# Patient Record
Sex: Female | Born: 1990 | Race: Black or African American | Hispanic: No | Marital: Single | Smoking: Current every day smoker
Health system: Southern US, Community
[De-identification: ages and names within clinical notes are randomized; demographics above are authoritative.]

## PROBLEM LIST (undated history)

## (undated) DIAGNOSIS — K219 Gastro-esophageal reflux disease without esophagitis: Secondary | ICD-10-CM

## (undated) HISTORY — PX: TONSILLECTOMY: SUR1361

## (undated) HISTORY — DX: Gastro-esophageal reflux disease without esophagitis: K21.9

---

## 2012-10-14 ENCOUNTER — Emergency Department (HOSPITAL_COMMUNITY)
Admission: EM | Admit: 2012-10-14 | Discharge: 2012-10-14 | Disposition: A | Discharge status: Home / Self Care | Payer: No Typology Code available for payment source | Attending: Emergency Medicine | Admitting: Emergency Medicine

## 2012-10-14 ENCOUNTER — Emergency Department (HOSPITAL_COMMUNITY): Payer: No Typology Code available for payment source

## 2012-10-14 ENCOUNTER — Encounter (HOSPITAL_COMMUNITY): Payer: Self-pay | Admitting: Unknown Physician Specialty

## 2012-10-14 DIAGNOSIS — Y939 Activity, unspecified: Secondary | ICD-10-CM | POA: Insufficient documentation

## 2012-10-14 DIAGNOSIS — Y929 Unspecified place or not applicable: Secondary | ICD-10-CM | POA: Insufficient documentation

## 2012-10-14 DIAGNOSIS — R42 Dizziness and giddiness: Secondary | ICD-10-CM | POA: Insufficient documentation

## 2012-10-14 DIAGNOSIS — F17200 Nicotine dependence, unspecified, uncomplicated: Secondary | ICD-10-CM | POA: Insufficient documentation

## 2012-10-14 DIAGNOSIS — S134XXA Sprain of ligaments of cervical spine, initial encounter: Secondary | ICD-10-CM

## 2012-10-14 DIAGNOSIS — M549 Dorsalgia, unspecified: Secondary | ICD-10-CM | POA: Insufficient documentation

## 2012-10-14 DIAGNOSIS — IMO0001 Reserved for inherently not codable concepts without codable children: Secondary | ICD-10-CM | POA: Insufficient documentation

## 2012-10-14 DIAGNOSIS — S139XXA Sprain of joints and ligaments of unspecified parts of neck, initial encounter: Secondary | ICD-10-CM | POA: Insufficient documentation

## 2012-10-14 DIAGNOSIS — R51 Headache: Secondary | ICD-10-CM | POA: Insufficient documentation

## 2012-10-14 LAB — PREGNANCY, URINE: Preg Test, Ur: NEGATIVE

## 2012-10-14 MED ORDER — KETOROLAC TROMETHAMINE 30 MG/ML IJ SOLN
30.0000 mg | Freq: Once | INTRAMUSCULAR | Status: AC
  Administered 2012-10-14: 30 mg via INTRAVENOUS
  Filled 2012-10-14: qty 1

## 2012-10-14 MED ORDER — ONDANSETRON HCL 4 MG/2ML IJ SOLN: 4.0000 mg | Freq: Once | INTRAMUSCULAR | Status: DC

## 2012-10-14 MED ORDER — IBUPROFEN 800 MG PO TABS: 800.0000 mg | ORAL_TABLET | Freq: Three times a day (TID) | ORAL | Status: AC | PRN

## 2012-10-14 MED ORDER — DIAZEPAM 5 MG PO TABS: 5.0000 mg | ORAL_TABLET | Freq: Three times a day (TID) | ORAL | Status: DC | PRN

## 2012-10-14 MED ORDER — HYDROMORPHONE HCL PF 1 MG/ML IJ SOLN
1.0000 mg | Freq: Once | INTRAMUSCULAR | Status: AC
  Administered 2012-10-14: 1 mg via INTRAVENOUS
  Filled 2012-10-14: qty 1

## 2012-10-14 MED ORDER — HYDROCODONE-ACETAMINOPHEN 5-325 MG PO TABS: 1.0000 | ORAL_TABLET | ORAL | Status: DC | PRN

## 2012-10-14 MED ORDER — METHOCARBAMOL 100 MG/ML IJ SOLN
1000.0000 mg | Freq: Once | INTRAMUSCULAR | Status: DC
  Filled 2012-10-14: qty 10

## 2012-10-14 MED ORDER — LORAZEPAM 2 MG/ML IJ SOLN
0.5000 mg | Freq: Once | INTRAMUSCULAR | Status: AC
  Administered 2012-10-14: 0.5 mg via INTRAVENOUS
  Filled 2012-10-14: qty 1

## 2012-10-14 MED ORDER — ONDANSETRON HCL 4 MG/2ML IJ SOLN
4.0000 mg | Freq: Once | INTRAMUSCULAR | Status: AC
  Administered 2012-10-14: 4 mg via INTRAVENOUS
  Filled 2012-10-14: qty 2
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
## 2012-10-14 NOTE — ED Notes (Signed)
Called Radiology to pick patient up post pregnancy test.
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
## 2012-10-14 NOTE — ED Provider Notes (Signed)
Medical screening examination/treatment/procedure(s) were performed by non-physician practitioner and as supervising physician I was immediately available for consultation/collaboration.   Carleene Cooper III, MD 10/14/12 [phone]

## 2012-10-15 NOTE — ED Provider Notes (Signed)
Medical screening examination/treatment/procedure(s) were performed by non-physician practitioner and as supervising physician I was immediately available for consultation/collaboration.   Carleene Cooper III, MD 10/15/12 [phone]

## 2012-12-12 ENCOUNTER — Emergency Department (HOSPITAL_BASED_OUTPATIENT_CLINIC_OR_DEPARTMENT_OTHER)
Admission: EM | Admit: 2012-12-12 | Discharge: 2012-12-12 | Disposition: A | Discharge status: Home / Self Care | Payer: Self-pay | Attending: Emergency Medicine | Admitting: Emergency Medicine

## 2012-12-12 ENCOUNTER — Encounter (HOSPITAL_BASED_OUTPATIENT_CLINIC_OR_DEPARTMENT_OTHER): Payer: Self-pay | Admitting: *Deleted

## 2012-12-12 DIAGNOSIS — Y929 Unspecified place or not applicable: Secondary | ICD-10-CM | POA: Insufficient documentation

## 2012-12-12 DIAGNOSIS — Z87828 Personal history of other (healed) physical injury and trauma: Secondary | ICD-10-CM | POA: Insufficient documentation

## 2012-12-12 DIAGNOSIS — Y939 Activity, unspecified: Secondary | ICD-10-CM | POA: Insufficient documentation

## 2012-12-12 DIAGNOSIS — S139XXA Sprain of joints and ligaments of unspecified parts of neck, initial encounter: Secondary | ICD-10-CM | POA: Insufficient documentation

## 2012-12-12 DIAGNOSIS — F17200 Nicotine dependence, unspecified, uncomplicated: Secondary | ICD-10-CM | POA: Insufficient documentation

## 2012-12-12 DIAGNOSIS — S161XXA Strain of muscle, fascia and tendon at neck level, initial encounter: Secondary | ICD-10-CM

## 2012-12-12 DIAGNOSIS — X500XXA Overexertion from strenuous movement or load, initial encounter: Secondary | ICD-10-CM | POA: Insufficient documentation

## 2012-12-12 MED ORDER — DEXAMETHASONE SODIUM PHOSPHATE 10 MG/ML IJ SOLN
10.0000 mg | Freq: Once | INTRAMUSCULAR | Status: AC
  Administered 2012-12-12: 10 mg via INTRAMUSCULAR
  Filled 2012-12-12: qty 1

## 2012-12-12 MED ORDER — PREDNISONE 10 MG PO TABS: 20.0000 mg | ORAL_TABLET | Freq: Every day | ORAL | Status: DC

## 2012-12-12 MED ORDER — OXYCODONE-ACETAMINOPHEN 5-325 MG PO TABS: 2.0000 | ORAL_TABLET | ORAL | Status: DC | PRN

## 2012-12-12 NOTE — ED Notes (Signed)
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

## 2012-12-12 NOTE — ED Notes (Signed)
Pt on phone with mother-mother concerned pt will need work note-advised her that I would speak with EDP r/t  RTW note
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
## 2012-12-12 NOTE — ED Notes (Signed)
Pt c/o left side neck pain x 3 days w/o injury

## 2013-01-08 NOTE — ED Notes (Signed)
I received a phone call from a pharmacist at [facility] in [place] inquiring about the prescriptions that this patient was prescribed during this visit.  Pt stated to pharmacist that she was given a prescription for antibiotics which she had filled at [facility] OP pharmacy and never filled the RX for percocet.  Pt is now presenting RX for percocet at [facility] asking for it to be filled there.  Pharmacist is asking for verification that there was one rx filled at our OP pharmacy, however I am unable to verify this due to the pharmacy being closed at the present time.  Pharmacist was informed that the patient was prescribed two medications, Percocet and prednisone and their dosages, quantities prescribed, and both with no refills.
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
## 2013-06-22 ENCOUNTER — Encounter (HOSPITAL_BASED_OUTPATIENT_CLINIC_OR_DEPARTMENT_OTHER): Payer: Self-pay | Admitting: *Deleted

## 2013-06-22 ENCOUNTER — Emergency Department (HOSPITAL_BASED_OUTPATIENT_CLINIC_OR_DEPARTMENT_OTHER)
Admission: EM | Admit: 2013-06-22 | Discharge: 2013-06-22 | Disposition: A | Discharge status: Home / Self Care | Payer: Self-pay | Attending: Emergency Medicine | Admitting: Emergency Medicine

## 2013-06-22 DIAGNOSIS — IMO0002 Reserved for concepts with insufficient information to code with codable children: Secondary | ICD-10-CM | POA: Insufficient documentation

## 2013-06-22 DIAGNOSIS — Z79899 Other long term (current) drug therapy: Secondary | ICD-10-CM | POA: Insufficient documentation

## 2013-06-22 DIAGNOSIS — Z3202 Encounter for pregnancy test, result negative: Secondary | ICD-10-CM | POA: Insufficient documentation

## 2013-06-22 DIAGNOSIS — F17200 Nicotine dependence, unspecified, uncomplicated: Secondary | ICD-10-CM | POA: Insufficient documentation

## 2013-06-22 DIAGNOSIS — R112 Nausea with vomiting, unspecified: Secondary | ICD-10-CM | POA: Insufficient documentation

## 2013-06-22 DIAGNOSIS — R1013 Epigastric pain: Secondary | ICD-10-CM | POA: Insufficient documentation

## 2013-06-22 LAB — URINALYSIS, ROUTINE W REFLEX MICROSCOPIC
Glucose, UA: NEGATIVE mg/dL
Ketones, ur: NEGATIVE mg/dL
Leukocytes, UA: NEGATIVE
Nitrite: NEGATIVE
Specific Gravity, Urine: 1.018 (ref 1.005–1.030)
pH: 7 (ref 5.0–8.0)

## 2013-06-22 LAB — COMPREHENSIVE METABOLIC PANEL
ALT: 23 U/L (ref 0–35)
Albumin: 3.9 g/dL (ref 3.5–5.2)
Alkaline Phosphatase: 84 U/L (ref 39–117)
BUN: 13 mg/dL (ref 6–23)
Chloride: 101 mEq/L (ref 96–112)
GFR calc Af Amer: >90 mL/min (ref >90)
Glucose, Bld: 124 mg/dL — ABNORMAL HIGH (ref 70–99)
Potassium: 4 mEq/L (ref 3.5–5.1)
Sodium: 138 mEq/L (ref 135–145)
Total Bilirubin: 0.2 mg/dL — ABNORMAL LOW (ref 0.3–1.2)
Total Protein: 7.6 g/dL (ref 6.0–8.3)

## 2013-06-22 LAB — CBC WITH DIFFERENTIAL/PLATELET
Hemoglobin: 12.7 g/dL (ref 12.0–15.0)
Lymphocytes Relative: 29 % (ref 12–46)
Lymphs Abs: 3.7 10*3/uL (ref 0.7–4.0)
Monocytes Relative: 6 % (ref 3–12)
Neutro Abs: 7.9 10*3/uL — ABNORMAL HIGH (ref 1.7–7.7)
Neutrophils Relative %: 63 % (ref 43–77)
Platelets: 394 10*3/uL (ref 150–400)
RBC: 4.93 MIL/uL (ref 3.87–5.11)
WBC: 12.5 10*3/uL — ABNORMAL HIGH (ref 4.0–10.5)

## 2013-06-22 LAB — LIPASE, BLOOD: Lipase: 31 U/L (ref 11–59)

## 2013-06-22 LAB — PREGNANCY, URINE: Preg Test, Ur: NEGATIVE

## 2013-06-22 MED ORDER — SUCRALFATE 1 G PO TABS: 1.0000 g | ORAL_TABLET | Freq: Four times a day (QID) | ORAL | Status: DC

## 2013-06-22 MED ORDER — OMEPRAZOLE 20 MG PO CPDR: DELAYED_RELEASE_CAPSULE | ORAL | Status: DC

## 2013-06-22 NOTE — ED Notes (Signed)
Pt reports increase in heartburn and (L) abdominal pain that is sharp intermittent pain.  Denies urinary symptoms.  LBM: 2 days ago.  Denies constipation. Reports pain improves after eating.  nontender on palpation

## 2013-06-22 NOTE — ED Provider Notes (Signed)
CSN: [id number]     Arrival date & time 06/22/13  1717 History     First MD Initiated Contact with Patient 06/22/13 1719     Chief Complaint  Patient presents with  . Abdominal Pain   (Consider location/radiation/quality/duration/timing/severity/associated sxs/prior Treatment) HPI Comments: Patient presents with complaint of upper abdominal pain for the past several weeks. Patient describes pain as sharp and throbbing with occasional radiation to the back. It is associated with nausea and occasional vomiting. It is made better with food. Patient denies fevers, chest pain, cough, diarrhea, blood in her stools, melena, urinary symptoms including dysuria and hematuria. Patient admits to drinking alcohol every day. She takes 2-3 packets of Goody powder every night before bed to help her sleep. She is also a smoker. No history of stomach ulcers. Onset of symptoms acute. Course is intermittent  The history is provided by the patient.    History reviewed. No pertinent past medical history. Past Surgical History  Procedure Laterality Date  . Tonsillectomy     History reviewed. No pertinent family history. History  Substance Use Topics  . Smoking status: Current Every Day Smoker -- 0.50 packs/day    Types: Cigarettes  . Smokeless tobacco: Not on file  . Alcohol Use: 3.0 oz/week    5 Shots of liquor per week     Comment: 2-3 times weekly    OB History   Grav Para Term Preterm Abortions TAB SAB Ect Mult Living                 Review of Systems  Constitutional: Negative for fever.  HENT: Negative for sore throat and rhinorrhea.   Eyes: Negative for redness.  Respiratory: Negative for cough.   Cardiovascular: Negative for chest pain.  Gastrointestinal: Positive for nausea, vomiting and abdominal pain. Negative for diarrhea and blood in stool.  Genitourinary: Negative for dysuria.  Musculoskeletal: Negative for myalgias.  Skin: Negative for rash.  Neurological: Negative for headaches.     Allergies  Peroxide  Home Medications   Current Outpatient Rx  Name  Route  Sig  Dispense  Refill  . diazepam (VALIUM) 5 MG tablet   Oral   Take 1 tablet (5 mg total) by mouth every 8 (eight) hours as needed (muscle spasm or moderate pain  ).   10 tablet   0   . HYDROcodone-acetaminophen (NORCO/VICODIN) 5-325 MG per tablet   Oral   Take 1 tablet by mouth every 4 (four) hours as needed (severe pain).   10 tablet   0   . ibuprofen (ADVIL,MOTRIN) 800 MG tablet   Oral   Take 1 tablet (800 mg total) by mouth every 8 (eight) hours as needed for pain.   21 tablet   0   . oxyCODONE-acetaminophen (PERCOCET/ROXICET) 5-325 MG per tablet   Oral   Take 2 tablets by mouth every 4 (four) hours as needed for pain.   15 tablet   0   . predniSONE (DELTASONE) 10 MG tablet   Oral   Take 2 tablets (20 mg total) by mouth daily.   10 tablet   0    BP 136/90  Pulse 95  Temp(Src) 98.1 F (36.7 C) (Oral)  Resp 18  Ht 5\' 5"  (1.651 m)  Wt 180 lb (81.647 kg)  BMI 29.95 kg/m2  SpO2 100% Physical Exam  Nursing note and vitals reviewed. Constitutional: She appears well-developed and well-nourished.  HENT:  Head: Normocephalic and atraumatic.  Eyes: Conjunctivae are normal. Right eye  exhibits no discharge. Left eye exhibits no discharge.  Neck: Normal range of motion. Neck supple.  Cardiovascular: Normal rate, regular rhythm and normal heart sounds.   Pulmonary/Chest: Effort normal and breath sounds normal.  Abdominal: Soft. There is no tenderness.  Neurological: She is alert.  Skin: Skin is warm and dry.  Psychiatric: She has a normal mood and affect.    ED Course   Procedures (including critical care time)  Labs Reviewed  URINALYSIS, ROUTINE W REFLEX MICROSCOPIC - Abnormal; Notable for the following:    APPearance CLOUDY (*)    All other components within normal limits  CBC WITH DIFFERENTIAL - Abnormal; Notable for the following:    WBC 12.5 (*)    MCV 76.3 (*)    MCH  25.8 (*)    Neutro Abs 7.9 (*)    All other components within normal limits  COMPREHENSIVE METABOLIC PANEL - Abnormal; Notable for the following:    Glucose, Bld 124 (*)    Total Bilirubin 0.2 (*)    GFR calc non Af Amer 79 (*)    All other components within normal limits  PREGNANCY, URINE  LIPASE, BLOOD   No results found. 1. Epigastric pain     6:14 PM Patient seen and examined. Work-up initiated.    Vital signs reviewed and are as follows: Filed Vitals:   06/22/13 1725  BP: 136/90  Pulse: 95  Temp: 98.1 F (36.7 C)  Resp: 18   8:37 PM patient informed of results. Her exam is stable in emergency department. She is not in any significant pain.  Patient discharged home on Prilosec and Carafate.  Patient counseled on avoidance of alcohol, Goody powder, smoking which can potentially exacerbate her symptoms.  Patient counseled on use of narcotic pain medications. Counseled not to combine these medications with others containing tylenol. Urged not to drink alcohol, drive, or perform any other activities that requires focus while taking these medications. The patient verbalizes understanding and agrees with the plan.   MDM  Patient with history and exam most consistent with peptic ulcer disease and GERD. Less likely gallbladder etiology. Labs are reassuring. Abdomen is nontender, soft on exam. Will treat conservatively with avoidance of smoking, Goody powder, alcohol. GI f/u given if not improving.   Renne Crigler, PA-C 06/22/13 2136

## 2013-06-26 NOTE — ED Provider Notes (Signed)
Medical screening examination/treatment/procedure(s) were performed by non-physician practitioner and as supervising physician I was immediately available for consultation/collaboration.    Christopher J. Pollina, MD 06/26/13 1503 

## 2013-08-04 ENCOUNTER — Encounter: Payer: Self-pay | Admitting: Internal Medicine

## 2013-08-04 ENCOUNTER — Ambulatory Visit: Payer: Self-pay | Attending: Internal Medicine | Admitting: Internal Medicine

## 2013-08-04 VITALS — BP 115/71 | HR 81 | Temp 98.6°F | Resp 16 | Ht 65.35 in | Wt 178.0 lb

## 2013-08-04 DIAGNOSIS — R0600 Dyspnea, unspecified: Secondary | ICD-10-CM

## 2013-08-04 DIAGNOSIS — R0609 Other forms of dyspnea: Secondary | ICD-10-CM

## 2013-08-04 DIAGNOSIS — K219 Gastro-esophageal reflux disease without esophagitis: Secondary | ICD-10-CM | POA: Insufficient documentation

## 2013-08-04 MED ORDER — OMEPRAZOLE 40 MG PO CPDR: 40.0000 mg | DELAYED_RELEASE_CAPSULE | Freq: Every day | ORAL | Status: AC

## 2013-08-04 MED ORDER — ALBUTEROL SULFATE HFA 108 (90 BASE) MCG/ACT IN AERS: 2.0000 | INHALATION_SPRAY | Freq: Four times a day (QID) | RESPIRATORY_TRACT | Status: AC | PRN

## 2013-08-04 NOTE — Progress Notes (Signed)
Pt is here to est care  Seen at [facility] ER in July for Acid reflux... Given Omeprazole States she has intermittent epigastric pain w/dyspnea Was referred to see a GI specialist but due to lack of insurance, has not been able to see one She is alert w/no signs of acute distress.

## 2013-08-04 NOTE — Progress Notes (Signed)
Patient ID: Renee Bernard, female   DOB: 28-Oct-1991, 22 y.o.   MRN: [id number] Patient Demographics  Renee Bernard, is a 22 y.o. female  [id number]  [id number]  DOB - Sep 18, 1991  Chief Complaint  Patient presents with  . Establish Care        Subjective:   Renee Bernard today is here to establish primary care.  The patient is a 22 year old female with no significant medical history except GERD presented to establish care. Patient reports that she's been having intermittent epigastric pain with acid reflux. Sometimes she has noticed that she also has dyspnea and she is unable to breathe. She felt that there was only one time when she had wheezing. Her school had just started otherwise she denies any acute panic attacks or significant stress or depression.  Patient has No headache, No chest pain,  No Nausea, No new weakness tingling or numbness, No Cough  Objective:    Filed Vitals:   08/04/13 1045  BP: 115/71  Pulse: 81  Temp: 98.6 F (37 C)  TempSrc: Oral  Resp: 16  Height: 5' 5.35" (1.66 m)  Weight: 178 lb (80.74 kg)  SpO2: 100%     ALLERGIES:   Allergies  Allergen Reactions  . Peroxide [Hydrogen Peroxide] Swelling    PAST MEDICAL HISTORY: Past Medical History  Diagnosis Date  . Acid reflux     PAST SURGICAL HISTORY: Past Surgical History  Procedure Laterality Date  . Tonsillectomy      FAMILY HISTORY: Family History  Problem Relation Age of Onset  . Hypertension Mother   . Hypertension Brother   . Cancer Maternal Grandmother     MEDICATIONS AT HOME: Prior to Admission medications   Medication Sig Start Date End Date Taking? Authorizing Provider  diazepam (VALIUM) 5 MG tablet Take 1 tablet (5 mg total) by mouth every 8 (eight) hours as needed (muscle spasm or moderate pain  ). 10/14/12   Trixie Dredge, PA-C  HYDROcodone-acetaminophen (NORCO/VICODIN) 5-325 MG per tablet Take 1 tablet by mouth every 4 (four) hours as needed (severe pain).  10/14/12   Trixie Dredge, PA-C  ibuprofen (ADVIL,MOTRIN) 800 MG tablet Take 1 tablet (800 mg total) by mouth every 8 (eight) hours as needed for pain. 10/14/12   Trixie Dredge, PA-C  omeprazole (PRILOSEC) 20 MG capsule Take one capsule PO twice a day for 3 days, then one capsule PO once a day 06/22/13   Renne Crigler, PA-C  omeprazole (PRILOSEC) 40 MG capsule Take 1 capsule (40 mg total) by mouth daily. 08/04/13   Ripudeep Jenna Luo, MD  oxyCODONE-acetaminophen (PERCOCET/ROXICET) 5-325 MG per tablet Take 2 tablets by mouth every 4 (four) hours as needed for pain. 12/12/12   Rolan Bucco, MD  predniSONE (DELTASONE) 10 MG tablet Take 2 tablets (20 mg total) by mouth daily. 12/12/12   Rolan Bucco, MD  sucralfate (CARAFATE) 1 G tablet Take 1 tablet (1 g total) by mouth 4 (four) times daily. Take at meals and before bed. 06/22/13   Renne Crigler, PA-C    REVIEW OF SYSTEMS:  Constitutional:   No   Fevers, chills, fatigue.  HEENT:    No headaches, Sore throat,   Cardio-vascular: No chest pain,  Orthopnea, swelling in lower extremities, anasarca, palpitations  GI:  Please see history of present illness  Resp:  No coughing up of blood.No cough.No wheezing. Please see history of present illness  Skin:  no rash or lesions.  GU:  no dysuria, change in color of urine, no  urgency or frequency.  No flank pain.  Musculoskeletal: No joint pain or swelling.  No decreased range of motion.  No back pain.  Psych: No change in mood or affect. No depression or anxiety.  No memory loss.   Exam  General appearance :Awake, alert, NAD, Speech Clear. HEENT: Atraumatic and Normocephalic, PERLA Neck: supple, no JVD. No cervical lymphadenopathy.  Chest: clear to auscultation bilaterally, no wheezing, rales or rhonchi CVS: S1 S2 regular, no murmurs.  Abdomen: soft, NBS, NT, ND, no gaurding, rigidity or rebound. Extremities: No cyanosis, clubbing, B/L Lower Ext shows no edema,  Neurology: Awake alert, and oriented  X 3, CN II-XII intact, Non focal Skin:No Rash or lesions Wounds: N/A    Data Review   Basic Metabolic Panel: No results found for this basename: NA, K, CL, CO2, GLUCOSE, BUN, CREATININE, CALCIUM, MG, PHOS,  in the last 168 hours Liver Function Tests: No results found for this basename: AST, ALT, ALKPHOS, BILITOT, PROT, ALBUMIN,  in the last 168 hours  CBC: No results found for this basename: WBC, NEUTROABS, HGB, HCT, MCV, PLT,  in the last 168 hours ------------------------------------------------------------------------------------------------------------------ No results found for this basename: HGBA1C,  in the last 72 hours ------------------------------------------------------------------------------------------------------------------ No results found for this basename: CHOL, HDL, LDLCALC, TRIG, CHOLHDL, LDLDIRECT,  in the last 72 hours ------------------------------------------------------------------------------------------------------------------ No results found for this basename: TSH, T4TOTAL, FREET3, T3FREE, THYROIDAB,  in the last 72 hours ------------------------------------------------------------------------------------------------------------------ No results found for this basename: VITAMINB12, FOLATE, FERRITIN, TIBC, IRON, RETICCTPCT,  in the last 72 hours  Coagulation profile  No results found for this basename: INR, PROTIME,  in the last 168 hours    Assessment & Plan   Active Problems: GERD: - Placed on omeprazole 40 mg daily, will continue with 4-6 weeks daily to assess improvement in her symptoms - Advise patient to stop taking it ibuprofen - Advise patient to stop spicy foods, fried foods, coffee  Dyspnea: Appears to be related with GERD versus acute anxiety versus intermittent asthma, patient has no wheezing at the time of physical examination. She just quit smoking and marijuana last month.  - Will try the albuterol inhaler as needed, chest x-ray - If  she still has the symptoms despite omeprazole and albuterol inhaler, check PFTs. May need a psych evaluation for acute panic attacks   Recommendations:Flu shot today Ambulatory refer to OB/GYN for Pap smear  Follow-up in 8 weeks   RAI,RIPUDEEP M.D. 08/04/2013, 11:04 AM

## 2013-08-08 ENCOUNTER — Encounter: Payer: Self-pay | Admitting: Medical

## 2013-09-29 ENCOUNTER — Encounter: Payer: Self-pay | Admitting: Medical

## 2013-10-04 ENCOUNTER — Ambulatory Visit: Payer: Self-pay

## 2014-03-13 ENCOUNTER — Encounter (HOSPITAL_BASED_OUTPATIENT_CLINIC_OR_DEPARTMENT_OTHER): Payer: Self-pay | Admitting: Emergency Medicine

## 2014-03-13 ENCOUNTER — Emergency Department (HOSPITAL_BASED_OUTPATIENT_CLINIC_OR_DEPARTMENT_OTHER)
Admission: EM | Admit: 2014-03-13 | Discharge: 2014-03-13 | Disposition: A | Discharge status: Home / Self Care | Payer: No Typology Code available for payment source | Attending: Emergency Medicine | Admitting: Emergency Medicine

## 2014-03-13 DIAGNOSIS — K219 Gastro-esophageal reflux disease without esophagitis: Secondary | ICD-10-CM | POA: Insufficient documentation

## 2014-03-13 DIAGNOSIS — H109 Unspecified conjunctivitis: Secondary | ICD-10-CM | POA: Insufficient documentation

## 2014-03-13 DIAGNOSIS — Z79899 Other long term (current) drug therapy: Secondary | ICD-10-CM | POA: Insufficient documentation

## 2014-03-13 DIAGNOSIS — F17200 Nicotine dependence, unspecified, uncomplicated: Secondary | ICD-10-CM | POA: Insufficient documentation

## 2014-03-13 MED ORDER — TOBRAMYCIN 0.3 % OP SOLN: 2.0000 [drp] | OPHTHALMIC | Status: AC

## 2014-03-13 NOTE — ED Provider Notes (Signed)
Medical screening examination/treatment/procedure(s) were performed by non-physician practitioner and as supervising physician I was immediately available for consultation/collaboration.   EKG Interpretation None        Junius ArgyleForrest S Tayona Sarnowski, MD 03/13/14 2150

## 2014-03-13 NOTE — Discharge Instructions (Signed)

## 2014-03-13 NOTE — ED Notes (Signed)
Pt. Reports she has redness and pain in the L eye.  Noted no discharge or drainage.

## 2014-03-13 NOTE — ED Provider Notes (Signed)
CSN: [id number]     Arrival date & time 03/13/14  1333 History   First MD Initiated Contact with Patient 03/13/14 1418     Chief Complaint  Patient presents with  . Eye Pain     (Consider location/radiation/quality/duration/timing/severity/associated sxs/prior Treatment) Patient is a 23 y.o. female presenting with eye pain. The history is provided by the patient. No language interpreter was used.  Eye Pain This is a new problem. The current episode started today. The problem occurs constantly. The problem has been gradually worsening. Nothing aggravates the symptoms. She has tried nothing for the symptoms. The treatment provided no relief.   Pt's employer is concerned that she has pink eye. Past Medical History  Diagnosis Date  . Acid reflux    Past Surgical History  Procedure Laterality Date  . Tonsillectomy     Family History  Problem Relation Age of Onset  . Hypertension Mother   . Hypertension Brother   . Cancer Maternal Grandmother    History  Substance Use Topics  . Smoking status: Current Every Day Smoker -- 0.50 packs/day    Types: Cigarettes  . Smokeless tobacco: Not on file  . Alcohol Use: 3.0 oz/week    5 Shots of liquor per week     Comment: 2-3 times weekly    OB History   Grav Para Term Preterm Abortions TAB SAB Ect Mult Living                 Review of Systems  Eyes: Positive for pain.  All other systems reviewed and are negative.     Allergies  Peroxide  Home Medications   Prior to Admission medications   Medication Sig Start Date End Date Taking? Authorizing Provider  albuterol (PROVENTIL HFA;VENTOLIN HFA) 108 (90 BASE) MCG/ACT inhaler Inhale 2 puffs into the lungs every 6 (six) hours as needed for wheezing. 08/04/13   Ripudeep Jenna LuoK Rai, MD  ibuprofen (ADVIL,MOTRIN) 800 MG tablet Take 1 tablet (800 mg total) by mouth every 8 (eight) hours as needed for pain. 10/14/12   Trixie DredgeEmily West, PA-C  omeprazole (PRILOSEC) 40 MG capsule Take 1 capsule (40 mg  total) by mouth daily. 08/04/13   Ripudeep Jenna LuoK Rai, MD  tobramycin (TOBREX) 0.3 % ophthalmic solution Place 2 drops into the left eye every 4 (four) hours. 03/13/14   Elson AreasLeslie K Georganna Maxson, PA-C   BP 135/78  Pulse 94  Temp(Src) 98.7 F (37.1 C) (Oral)  Resp 16  Ht 5\' 5"  (1.651 m)  Wt 185 lb (83.915 kg)  BMI 30.79 kg/m2  SpO2 100%  LMP 03/13/2014 Physical Exam  Nursing note and vitals reviewed. Constitutional: She is oriented to person, place, and time. She appears well-developed and well-nourished.  Eyes: Pupils are equal, round, and reactive to light.  Injected left eye,  No exudate  Cardiovascular: Normal rate.   Pulmonary/Chest: Effort normal.  Musculoskeletal: Normal range of motion.  Neurological: She is alert and oriented to person, place, and time. She has normal reflexes.  Skin: Skin is warm.  Psychiatric: She has a normal mood and affect.    ED Course  Procedures (including critical care time) Labs Review Labs Reviewed - No data to display  Imaging Review No results found.   EKG Interpretation None      MDM   Final diagnoses:  Conjunctivitis    tobrex    Elson AreasLeslie K Tamon Parkerson, PA-C 03/13/14 1450  Lonia SkinnerLeslie K EdgertonSofia, [place] JerseyPA-C 03/13/14 1455
